# Patient Record
Sex: Male | Born: 2000 | Race: Black or African American | Hispanic: No | Marital: Single | Smoking: Current every day smoker
Health system: Southern US, Community
[De-identification: ages and names within clinical notes are randomized; demographics above are authoritative.]

## PROBLEM LIST (undated history)

## (undated) DIAGNOSIS — K429 Umbilical hernia without obstruction or gangrene: Secondary | ICD-10-CM

## (undated) HISTORY — PX: UMBILICAL HERNIA REPAIR: SHX196

---
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
## 2019-10-28 ENCOUNTER — Emergency Department (HOSPITAL_COMMUNITY)
Admission: EM | Admit: 2019-10-28 | Discharge: 2019-10-28 | Disposition: A | Discharge status: Home / Self Care | Payer: Medicaid Other | Attending: Emergency Medicine | Admitting: Emergency Medicine

## 2019-10-28 ENCOUNTER — Encounter (HOSPITAL_COMMUNITY): Payer: Self-pay | Admitting: Emergency Medicine

## 2019-10-28 DIAGNOSIS — W260XXA Contact with knife, initial encounter: Secondary | ICD-10-CM | POA: Diagnosis not present

## 2019-10-28 DIAGNOSIS — Y929 Unspecified place or not applicable: Secondary | ICD-10-CM | POA: Insufficient documentation

## 2019-10-28 DIAGNOSIS — Y999 Unspecified external cause status: Secondary | ICD-10-CM | POA: Diagnosis not present

## 2019-10-28 DIAGNOSIS — Y939 Activity, unspecified: Secondary | ICD-10-CM | POA: Insufficient documentation

## 2019-10-28 DIAGNOSIS — S61012A Laceration without foreign body of left thumb without damage to nail, initial encounter: Secondary | ICD-10-CM | POA: Diagnosis not present

## 2019-10-28 DIAGNOSIS — Z23 Encounter for immunization: Secondary | ICD-10-CM | POA: Diagnosis not present

## 2019-10-28 MED ORDER — LIDOCAINE HCL (PF) 1 % IJ SOLN
5.0000 mL | Freq: Once | INTRAMUSCULAR | Status: AC
  Administered 2019-10-28: 5 mL
  Filled 2019-10-28: qty 5

## 2019-10-28 MED ORDER — TETANUS-DIPHTH-ACELL PERTUSSIS 5-2.5-18.5 LF-MCG/0.5 IM SUSP
0.5000 mL | Freq: Once | INTRAMUSCULAR | Status: AC
  Administered 2019-10-28: 0.5 mL via INTRAMUSCULAR
  Filled 2019-10-28: qty 0.5

## 2019-10-28 NOTE — ED Triage Notes (Signed)
Pt reports he accidentally cut L hand near thumb with box cutter earlier today.

## 2019-10-28 NOTE — ED Provider Notes (Signed)
[facility] EMERGENCY DEPARTMENT Provider Note   CSN: [id number] Arrival date & time: 10/28/19  2041     History Chief Complaint  Patient presents with  . Laceration    Roger Valentine is a 19 y.o. male.  HPI 19 y.o. male with complaints/comments per nursing/medical assistant note, with all such history reviewed for accuracy and confirmed by myself.  The patient's relevant past medical, surgical and social history was reviewed in Epic.  HPI Comments: Roger Valentine is a 19 y.o. male with no significant medical history who presents to the Emergency Department complaining of a laceration that occurred to his left hand about 8 hours ago. Pt was cutting boxes with a box cutter and slipped resulting in a 3 cm laceration to the proximal dorsal aspect of the left thumb. No active bleeding. He notes mild pain overlying the site. He cleaned the wound with peroxide PTA and was urged by a family member to come to the ED for sutures. He is unsure of the timing of his last tetanus vaccination.  He denies any CP, SOB, numbness, tingling, weakness.     Past Medical History:  Diagnosis Date  . Umbilical hernia     There are no problems to display for this patient.   Past Surgical History:  Procedure Laterality Date  . UMBILICAL HERNIA REPAIR         No family history on file.  Social History   Tobacco Use  . Smoking status: Never Smoker  . Smokeless tobacco: Never Used  Substance Use Topics  . Alcohol use: No  . Drug use: Not on file    Home Medications Prior to Admission medications   Medication Sig Start Date End Date Taking? Authorizing Provider  amoxicillin (AMOXIL) 500 MG capsule Take 1 capsule (500 mg total) by mouth 3 (three) times daily. 06/27/18   Fayrene Helper, PA-C  benzonatate (TESSALON) 100 MG capsule Take 1 capsule (100 mg total) by mouth every 8 (eight) hours. 06/27/18   Fayrene Helper, PA-C  ibuprofen (ADVIL,MOTRIN) 100 MG/5ML suspension Take  30 mLs (600 mg total) by mouth every 6 (six) hours as needed for pain or fever. 05/06/13   Marcellina Millin, MD  loratadine (CLARITIN) 10 MG tablet Take 10 mg by mouth daily.    [provider]    Allergies    Patient has no known allergies.  Review of Systems   Review of Systems  Musculoskeletal: Positive for myalgias.  Skin: Positive for wound. Negative for color change.  Neurological: Negative for weakness and numbness.    Physical Exam Updated Vital Signs BP 128/69 (BP Location: Right Arm)   Pulse 78   Temp 98.2 F (36.8 C) (Oral)   Resp 16   SpO2 97%   Physical Exam Vitals and nursing note reviewed.  Constitutional:      General: He is not in acute distress.    Appearance: Normal appearance. He is normal weight. He is not ill-appearing, toxic-appearing or diaphoretic.  HENT:     Head: Normocephalic and atraumatic.     Nose: Nose normal.  Eyes:     Extraocular Movements: Extraocular movements intact.  Cardiovascular:     Rate and Rhythm: Normal rate and regular rhythm.     Pulses: Normal pulses.     Heart sounds: Normal heart sounds. No murmur. No friction rub. No gallop.   Pulmonary:     Effort: Pulmonary effort is normal. No respiratory distress.     Breath sounds:  Normal breath sounds. No stridor. No wheezing, rhonchi or rales.  Abdominal:     General: Abdomen is flat.  Musculoskeletal:     Cervical back: Normal range of motion.  Skin:    General: Skin is warm and dry.     Capillary Refill: Capillary refill takes less than 2 seconds.     Findings: Wound present.     Comments: 3 cm well approximated laceration noted to the dorsal aspect of the left thumb. No active bleeding. Mild TTP overlying the site. FROM of the fingers of the left hand. Good cap refill. Distal sensation intact. Grip strength 5/5.   Neurological:     General: No focal deficit present.     Mental Status: He is alert.     Sensory: Sensation is intact.  Psychiatric:        Mood and  Affect: Mood normal.        Behavior: Behavior normal.     ED Results / Procedures / Treatments   Labs (all labs ordered are listed, but only abnormal results are displayed) Labs Reviewed - No data to display  EKG None  Radiology No results found.  Procedures .Marland KitchenLaceration Repair  Date/Time: 10/28/2019 11:06 PM Performed by: Placido Sou, PA-C Authorized by: Placido Sou, PA-C   Consent:    Consent obtained:  Verbal   Consent given by:  Patient   Risks discussed:  Infection, pain and retained foreign body   Alternatives discussed:  No treatment Anesthesia (see MAR for exact dosages):    Anesthesia method:  Local infiltration   Local anesthetic:  Lidocaine 1% w/o epi Laceration details:    Location:  Finger   Finger location:  L thumb   Length (cm):  3 Repair type:    Repair type:  Simple Pre-procedure details:    Preparation:  Patient was prepped and draped in usual sterile fashion Exploration:    Hemostasis achieved with:  Direct pressure   Wound exploration: wound explored through full range of motion     Contaminated: no   Treatment:    Area cleansed with:  Betadine   Amount of cleaning:  Standard   Irrigation solution:  Sterile water   Visualized foreign bodies/material removed: no   Skin repair:    Repair method:  Sutures   Suture size:  5-0   Suture material:  Prolene   Suture technique:  Simple interrupted   Number of sutures:  5 Approximation:    Approximation:  Close Post-procedure details:    Dressing:  Antibiotic ointment and adhesive bandage   Patient tolerance of procedure:  Tolerated well, no immediate complications   (including critical care time)  Medications Ordered in ED Medications  lidocaine (PF) (XYLOCAINE) 1 % injection 5 mL (has no administration in time range)  Tdap (BOOSTRIX) injection 0.5 mL (has no administration in time range)   ED Course  I have reviewed the triage vital signs and the nursing notes.  Pertinent  labs & imaging results that were available during my care of the patient were reviewed by me and considered in my medical decision making (see chart for details).    MDM Rules/Calculators/A&P                      Pt is a 19 year old male that presents with a 3 cm laceration to the left proximal dorsal thumb. Wound well approximated and closed with 5 simple interrupted 5-0 prolene sutures. He tolerated the procedure well. Tetanus  updated in the ED. He works in a [employer] and discussed properly cleaning the wound daily, applying antibiotic ointment to the site and keeping it bandaged and covered while at work. He understands to return to the ED or visit his PCP in 7-10 days for suture removal.  Discussed next steps with the patient and their questions were answered. They verbalized understanding. They were amicable at the time of discharge. VSS.  Patient discharged to home/self care.  Condition at discharge: Stable  Note: Portions of this report may have been transcribed using voice recognition software. Every effort was made to ensure accuracy; however, inadvertent computerized transcription errors may be present.    Final Clinical Impression(s) / ED Diagnoses Final diagnoses:  Laceration of left thumb without foreign body without damage to nail, initial encounter    Rx / DC Orders ED Discharge Orders    None       Rayna Sexton, PA-C 10/28/19 2317    Malvin Johns, MD 10/28/19 2350

## 2019-10-28 NOTE — Discharge Instructions (Signed)
Please return to the emergency department or visit your PCP in 7-10 days for removal of your sutures. Please properly clean the wound daily and apply antibiotic ointment to the site. Return to the emergency department with any new or worsening symptoms including but not limited to fevers, chills, worsening pain, or discharge from the wound.

## 2019-11-12 ENCOUNTER — Other Ambulatory Visit: Payer: Self-pay

## 2019-11-12 ENCOUNTER — Ambulatory Visit (HOSPITAL_COMMUNITY)
Admission: EM | Admit: 2019-11-12 | Discharge: 2019-11-12 | Disposition: A | Discharge status: Home / Self Care | Payer: Medicaid Other

## 2019-11-12 DIAGNOSIS — Z4802 Encounter for removal of sutures: Secondary | ICD-10-CM

## 2019-11-12 NOTE — ED Triage Notes (Signed)
Patient here today for suture removal x 5 at left thumb. Suture place on 10/28/2019. Wound clean and dry. instruction review with patient to keep area clean and dry. Follow up if necessary.

## 2021-12-28 ENCOUNTER — Ambulatory Visit
Admission: RE | Admit: 2021-12-28 | Discharge: 2021-12-28 | Disposition: A | Discharge status: Home / Self Care | Payer: Medicaid Other | Source: Ambulatory Visit | Attending: Family Medicine | Admitting: Family Medicine

## 2021-12-28 ENCOUNTER — Other Ambulatory Visit: Payer: Self-pay

## 2021-12-28 VITALS — BP 131/74 | HR 85 | Temp 98.2°F | Resp 18

## 2021-12-28 DIAGNOSIS — Z113 Encounter for screening for infections with a predominantly sexual mode of transmission: Secondary | ICD-10-CM | POA: Insufficient documentation

## 2021-12-28 NOTE — ED Triage Notes (Signed)
Pt here for STD screening; pt denies sx  

## 2021-12-28 NOTE — ED Provider Notes (Signed)
[facility]    CSN: [id number] Arrival date & time: 12/28/21  2778      History   Chief Complaint Chief Complaint  Patient presents with   Exposure to STD    HPI Roger Valentine is a 21 y.o. male.    Exposure to STD  Here for STD screening.  He does not have any penile discharge or itching or dysuria.  No fever or abdominal pain  Past Medical History:  Diagnosis Date   Umbilical hernia     There are no problems to display for this patient.   Past Surgical History:  Procedure Laterality Date   UMBILICAL HERNIA REPAIR         Home Medications    Prior to Admission medications   Medication Sig Start Date End Date Taking? Authorizing Provider  benzonatate (TESSALON) 100 MG capsule Take 1 capsule (100 mg total) by mouth every 8 (eight) hours. Patient not taking: Reported on 10/28/2019 06/27/18   Fayrene Helper, PA-C  ibuprofen (ADVIL,MOTRIN) 100 MG/5ML suspension Take 30 mLs (600 mg total) by mouth every 6 (six) hours as needed for pain or fever. Patient not taking: Reported on 10/28/2019 05/06/13   Marcellina Millin, MD    Family History History reviewed. No pertinent family history.  Social History Social History   Tobacco Use   Smoking status: Never   Smokeless tobacco: Never  Substance Use Topics   Alcohol use: No     Allergies   Patient has no known allergies.   Review of Systems Review of Systems   Physical Exam Triage Vital Signs ED Triage Vitals  Enc Vitals Group     BP 12/28/21 1045 131/74     Pulse Rate 12/28/21 1045 85     Resp 12/28/21 1045 18     Temp 12/28/21 1045 98.2 F (36.8 C)     Temp Source 12/28/21 1045 Oral     SpO2 12/28/21 1045 98 %     Weight --      Height --      Head Circumference --      Peak Flow --      Pain Score 12/28/21 1046 0     Pain Loc --      Pain Edu? --      Excl. in GC? --    No data found.  Updated Vital Signs BP 131/74 (BP Location: Left Arm)   Pulse 85   Temp 98.2 F (36.8 C)  (Oral)   Resp 18   SpO2 98%   Visual Acuity Right Eye Distance:   Left Eye Distance:   Bilateral Distance:    Right Eye Near:   Left Eye Near:    Bilateral Near:     Physical Exam Vitals reviewed.  Constitutional:      General: He is not in acute distress.    Appearance: He is not ill-appearing, toxic-appearing or diaphoretic.  Cardiovascular:     Rate and Rhythm: Normal rate and regular rhythm.  Skin:    Coloration: Skin is not jaundiced or pale.  Neurological:     Mental Status: He is alert and oriented to person, place, and time.  Psychiatric:        Behavior: Behavior normal.     UC Treatments / Results  Labs (all labs ordered are listed, but only abnormal results are displayed) Labs Reviewed  CYTOLOGY, (ORAL, ANAL, URETHRAL) ANCILLARY ONLY    EKG   Radiology No results found.  Procedures Procedures (  including critical care time)  Medications Ordered in UC Medications - No data to display  Initial Impression / Assessment and Plan / UC Course  I have reviewed the triage vital signs and the nursing notes.  Pertinent labs & imaging results that were available during my care of the patient were reviewed by me and considered in my medical decision making (see chart for details).     Self swab is done, and he will notified of any positives and be treated per protocol.  He declines any blood test today Final Clinical Impressions(s) / UC Diagnoses   Final diagnoses:  None   Discharge Instructions   None    ED Prescriptions   None    PDMP not reviewed this encounter.   Zenia Resides, MD 12/28/21 1106

## 2021-12-28 NOTE — Discharge Instructions (Addendum)
Staff will notify you if anything is positive on the swab 

## 2021-12-31 LAB — CYTOLOGY, (ORAL, ANAL, URETHRAL) ANCILLARY ONLY
Chlamydia: NEGATIVE
Comment: NEGATIVE
Comment: NEGATIVE
Comment: NORMAL
Neisseria Gonorrhea: NEGATIVE
Trichomonas: POSITIVE — AB

## 2022-01-01 ENCOUNTER — Telehealth (HOSPITAL_COMMUNITY): Payer: Self-pay | Admitting: Emergency Medicine

## 2022-01-01 MED ORDER — METRONIDAZOLE 500 MG PO TABS: 2000.0000 mg | ORAL_TABLET | Freq: Once | ORAL | 0 refills | Status: AC

## 2022-02-16 ENCOUNTER — Ambulatory Visit
Admission: EM | Admit: 2022-02-16 | Discharge: 2022-02-16 | Disposition: A | Discharge status: Home / Self Care | Payer: Medicaid Other | Attending: Physician Assistant | Admitting: Physician Assistant

## 2022-02-16 DIAGNOSIS — Z113 Encounter for screening for infections with a predominantly sexual mode of transmission: Secondary | ICD-10-CM

## 2022-02-16 DIAGNOSIS — N4889 Other specified disorders of penis: Secondary | ICD-10-CM

## 2022-02-16 NOTE — Discharge Instructions (Signed)
Monitor your MyChart for these results.  We will contact you if anything is positive and we need to arrange treatment.  Do not have sex until you receive the results.  You should use a condom for sexual encounter.  If you develop any recurrent irritation or additional symptoms including discharge, burning when you pee, fever, abdominal pain, nausea, vomiting you should be seen immediately.

## 2022-02-16 NOTE — ED Triage Notes (Signed)
Pt c/o penile itching onset ~ 3-4 days ago. Requesting sti screening. Denies known exposure.

## 2022-02-16 NOTE — ED Provider Notes (Signed)
[facility]    CSN: [id number] Arrival date & time: 02/16/22  5621      History   Chief Complaint Chief Complaint  Patient presents with   sti screening    HPI Roger Valentine is a 21 y.o. male.   Patient presents today requesting STI testing.  He denies any current symptoms including penile irritation, pelvic pain, abdominal pain, fever, nausea, vomiting, penile discharge, dysuria.  He does have a history of trichomonas in May 2023 and reports completing treatment with resolution of symptoms.  He is sexually active with male partners but does not consistently use condoms.  Denies recent antibiotic use.  He was experiencing some balanitis which he describes as irritation of the glans penis.  This has since resolved and he is not experiencing any symptoms.  He declined HIV and syphilis testing.  Reports that balanitis symptoms have resolved and he does not need any medication for this and just requires STI testing.    Past Medical History:  Diagnosis Date   Umbilical hernia     There are no problems to display for this patient.   Past Surgical History:  Procedure Laterality Date   UMBILICAL HERNIA REPAIR         Home Medications    Prior to Admission medications   Not on File    Family History History reviewed. No pertinent family history.  Social History Social History   Tobacco Use   Smoking status: Never   Smokeless tobacco: Never  Substance Use Topics   Alcohol use: No     Allergies   Patient has no known allergies.   Review of Systems Review of Systems  Constitutional:  Negative for activity change, appetite change, fatigue and fever.  Gastrointestinal:  Negative for abdominal pain, diarrhea, nausea and vomiting.  Genitourinary:  Negative for dysuria, frequency, genital sores, penile discharge, penile pain, penile swelling, testicular pain and urgency.     Physical Exam Triage Vital Signs ED Triage Vitals [02/16/22 1017]   Enc Vitals Group     BP 120/60     Pulse Rate 88     Resp 18     Temp 98 F (36.7 C)     Temp Source Oral     SpO2 98 %     Weight      Height      Head Circumference      Peak Flow      Pain Score 0     Pain Loc      Pain Edu?      Excl. in GC?    No data found.  Updated Vital Signs BP 120/60 (BP Location: Left Arm)   Pulse 88   Temp 98 F (36.7 C) (Oral)   Resp 18   SpO2 98%   Visual Acuity Right Eye Distance:   Left Eye Distance:   Bilateral Distance:    Right Eye Near:   Left Eye Near:    Bilateral Near:     Physical Exam Vitals reviewed.  Constitutional:      General: He is awake.     Appearance: Normal appearance. He is well-developed. He is not ill-appearing.     Comments: Very pleasant male appears stated age in no acute distress sitting comfortably in exam room  HENT:     Head: Normocephalic and atraumatic.     Mouth/Throat:     Pharynx: No oropharyngeal exudate, posterior oropharyngeal erythema or uvula swelling.  Cardiovascular:  Rate and Rhythm: Normal rate and regular rhythm.     Heart sounds: Normal heart sounds, S1 normal and S2 normal. No murmur heard. Pulmonary:     Effort: Pulmonary effort is normal.     Breath sounds: Normal breath sounds. No stridor. No wheezing, rhonchi or rales.     Comments: Clear to auscultation bilaterally Abdominal:     General: Bowel sounds are normal.     Palpations: Abdomen is soft.     Tenderness: There is no abdominal tenderness. There is no right CVA tenderness, left CVA tenderness, guarding or rebound.  Genitourinary:    Comments: Patient declined exam Neurological:     Mental Status: He is alert.  Psychiatric:        Behavior: Behavior is cooperative.      UC Treatments / Results  Labs (all labs ordered are listed, but only abnormal results are displayed) Labs Reviewed - No data to display  EKG   Radiology No results found.  Procedures Procedures (including critical care  time)  Medications Ordered in UC Medications - No data to display  Initial Impression / Assessment and Plan / UC Course  I have reviewed the triage vital signs and the nursing notes.  Pertinent labs & imaging results that were available during my care of the patient were reviewed by me and considered in my medical decision making (see chart for details).     Offered evaluation of penis to ensure that he does not have current balanitis that requires treatment.  Patient reports that his symptoms have resolved and he declined evaluation.  Offered empiric treatment with hydrocortisone which he also declined.  STI swab collected today-results pending.  We will contact him if we need to arrange any treatment.  He is to monitor his MyChart for results.  Offered HIV and syphilis testing which he declined.  Offered STI information handout which he declined.  Discussed that if he develops any symptoms he needs to return for reevaluation.  Final Clinical Impressions(s) / UC Diagnoses   Final diagnoses:  Routine screening for STI (sexually transmitted infection)  Penile irritation     Discharge Instructions      Monitor your MyChart for these results.  We will contact you if anything is positive and we need to arrange treatment.  Do not have sex until you receive the results.  You should use a condom for sexual encounter.  If you develop any recurrent irritation or additional symptoms including discharge, burning when you pee, fever, abdominal pain, nausea, vomiting you should be seen immediately.    ED Prescriptions   None    PDMP not reviewed this encounter.   Jeani Hawking, PA-C 02/16/22 1239

## 2022-02-19 LAB — CYTOLOGY, (ORAL, ANAL, URETHRAL) ANCILLARY ONLY
Chlamydia: NEGATIVE
Comment: NEGATIVE
Comment: NEGATIVE
Comment: NORMAL
Neisseria Gonorrhea: NEGATIVE
Trichomonas: POSITIVE — AB

## 2022-02-20 ENCOUNTER — Telehealth (HOSPITAL_COMMUNITY): Payer: Self-pay | Admitting: Emergency Medicine

## 2022-02-20 MED ORDER — METRONIDAZOLE 500 MG PO TABS: 2000.0000 mg | ORAL_TABLET | Freq: Once | ORAL | 0 refills | Status: AC

## 2022-03-07 ENCOUNTER — Ambulatory Visit (HOSPITAL_COMMUNITY): Payer: Self-pay

## 2022-06-23 ENCOUNTER — Ambulatory Visit
Admission: RE | Admit: 2022-06-23 | Discharge: 2022-06-23 | Disposition: A | Discharge status: Home / Self Care | Payer: Medicaid Other | Source: Ambulatory Visit | Attending: Urgent Care | Admitting: Urgent Care

## 2022-06-23 VITALS — BP 116/72 | HR 74 | Temp 98.1°F | Resp 16

## 2022-06-23 DIAGNOSIS — Z202 Contact with and (suspected) exposure to infections with a predominantly sexual mode of transmission: Secondary | ICD-10-CM

## 2022-06-23 DIAGNOSIS — Z113 Encounter for screening for infections with a predominantly sexual mode of transmission: Secondary | ICD-10-CM | POA: Insufficient documentation

## 2022-06-23 NOTE — ED Provider Notes (Signed)
  [facility]  Note:  This document was prepared using Conservation officer, historic buildings and may include unintentional dictation errors.  MRN: [id number] DOB: 08/14/2000  Subjective:   Roger Valentine is a 21 y.o. male presenting for STI testing using the cytology swab. Denies dysuria, hematuria, urinary frequency, penile discharge, penile swelling, testicular pain, testicular swelling, anal pain, groin pain.  Has a history of trichomoniasis, last positive test was July of this past year.  He is simply try to make sure that he does not have any kind of infection.  No known exposures.  No current facility-administered medications for this encounter. No current outpatient medications on file.   No Known Allergies  Past Medical History:  Diagnosis Date   Umbilical hernia      Past Surgical History:  Procedure Laterality Date   UMBILICAL HERNIA REPAIR      History reviewed. No pertinent family history.  Social History   Tobacco Use   Smoking status: Every Day    Types: Cigars   Smokeless tobacco: Never  Vaping Use   Vaping Use: Never used  Substance Use Topics   Alcohol use: Yes    Comment: occ   Drug use: Not Currently    ROS   Objective:   Vitals: BP 116/72 (BP Location: Right Arm)   Pulse 74   Temp 98.1 F (36.7 C) (Oral)   Resp 16   SpO2 98%   Physical Exam Constitutional:      General: He is not in acute distress.    Appearance: Normal appearance. He is well-developed and normal weight. He is not ill-appearing, toxic-appearing or diaphoretic.  HENT:     Head: Normocephalic and atraumatic.     Right Ear: External ear normal.     Left Ear: External ear normal.     Nose: Nose normal.     Mouth/Throat:     Pharynx: Oropharynx is clear.  Eyes:     General: No scleral icterus.       Right eye: No discharge.        Left eye: No discharge.     Extraocular Movements: Extraocular movements intact.  Cardiovascular:     Rate and  Rhythm: Normal rate.  Pulmonary:     Effort: Pulmonary effort is normal.  Musculoskeletal:     Cervical back: Normal range of motion.  Neurological:     Mental Status: He is alert and oriented to person, place, and time.  Psychiatric:        Mood and Affect: Mood normal.        Behavior: Behavior normal.        Thought Content: Thought content normal.        Judgment: Judgment normal.     Assessment and Plan :   PDMP not reviewed this encounter.  1. Screen for STD (sexually transmitted disease)     Cytology pending we will treat as appropriate based off of results.   Wallis Bamberg, [place] 06/23/22 [phone]

## 2022-06-23 NOTE — Discharge Instructions (Addendum)
We will let you know through MyChart if your results are negative.  If your results are positive for the penis swab then our nurse that follows up with results we will give you a call.

## 2022-06-23 NOTE — ED Triage Notes (Signed)
Pt requesting STD screening-denies c/o-denies known exposure-NAD-steady gait

## 2022-06-24 LAB — CYTOLOGY, (ORAL, ANAL, URETHRAL) ANCILLARY ONLY
Chlamydia: NEGATIVE
Comment: NEGATIVE
Comment: NEGATIVE
Comment: NORMAL
Neisseria Gonorrhea: NEGATIVE
Trichomonas: POSITIVE — AB

## 2022-06-25 ENCOUNTER — Telehealth (HOSPITAL_COMMUNITY): Payer: Self-pay | Admitting: Emergency Medicine

## 2022-06-25 MED ORDER — METRONIDAZOLE 500 MG PO TABS: 2000.0000 mg | ORAL_TABLET | Freq: Once | ORAL | 0 refills | Status: AC

## 2022-12-07 ENCOUNTER — Ambulatory Visit
Admission: EM | Admit: 2022-12-07 | Discharge: 2022-12-07 | Disposition: A | Discharge status: Home / Self Care | Payer: Medicaid Other | Attending: Family Medicine | Admitting: Family Medicine

## 2022-12-07 DIAGNOSIS — Z202 Contact with and (suspected) exposure to infections with a predominantly sexual mode of transmission: Secondary | ICD-10-CM | POA: Insufficient documentation

## 2022-12-07 NOTE — ED Provider Notes (Signed)
[name]    CSN: [id number] Arrival date & time: 12/07/22  1242      History   Chief Complaint Chief Complaint  Patient presents with   sti check     HPI Roger Valentine is a 22 y.o. male.   HPI 22 year old male presents with possible STD exposure.  Request STD check.  Patient denies current symptoms of STD.  PMH significant for umbilical hernia   Past Medical History:  Diagnosis Date   Umbilical hernia     There are no problems to display for this patient.   Past Surgical History:  Procedure Laterality Date   UMBILICAL HERNIA REPAIR         Home Medications    Prior to Admission medications   Not on File    Family History No family history on file.  Social History Social History   Tobacco Use   Smoking status: Every Day    Types: Cigars   Smokeless tobacco: Never  Vaping Use   Vaping Use: Never used  Substance Use Topics   Alcohol use: Yes    Comment: occ   Drug use: Not Currently     Allergies   Patient has no known allergies.   Review of Systems Review of Systems  All other systems reviewed and are negative.    Physical Exam Triage Vital Signs ED Triage Vitals  Enc Vitals Group     BP 12/07/22 1256 106/72     Pulse Rate 12/07/22 1256 87     Resp 12/07/22 1256 20     Temp 12/07/22 1256 98 F (36.7 C)     Temp Source 12/07/22 1256 Oral     SpO2 12/07/22 1256 97 %     Weight --      Height --      Head Circumference --      Peak Flow --      Pain Score 12/07/22 1255 0     Pain Loc --      Pain Edu? --      Excl. in GC? --    No data found.  Updated Vital Signs BP 106/72   Pulse 87   Temp 98 F (36.7 C) (Oral)   Resp 20   SpO2 97%   Physical Exam Vitals and nursing note reviewed.  Constitutional:      Appearance: Normal appearance. He is normal weight.  HENT:     Head: Normocephalic and atraumatic.     Mouth/Throat:     Mouth: Mucous membranes are moist.     Pharynx: Oropharynx is clear.   Eyes:     Extraocular Movements: Extraocular movements intact.     Conjunctiva/sclera: Conjunctivae normal.     Pupils: Pupils are equal, round, and reactive to light.  Cardiovascular:     Rate and Rhythm: Normal rate and regular rhythm.     Pulses: Normal pulses.     Heart sounds: Normal heart sounds.  Pulmonary:     Effort: Pulmonary effort is normal.     Breath sounds: Normal breath sounds. No wheezing, rhonchi or rales.  Musculoskeletal:        General: Normal range of motion.     Cervical back: Normal range of motion and neck supple.  Skin:    General: Skin is warm and dry.  Neurological:     General: No focal deficit present.     Mental Status: He is alert and oriented to person, place, and time. Mental  status is at baseline.      UC Treatments / Results  Labs (all labs ordered are listed, but only abnormal results are displayed) Labs Reviewed  CYTOLOGY, (ORAL, ANAL, URETHRAL) ANCILLARY ONLY    EKG   Radiology No results found.  Procedures Procedures (including critical care time)  Medications Ordered in UC Medications - No data to display  Initial Impression / Assessment and Plan / UC Course  I have reviewed the triage vital signs and the nursing notes.  Pertinent labs & imaging results that were available during my care of the patient were reviewed by me and considered in my medical decision making (see chart for details).     MDM: 1.  Potential exposure to STD-Aptima swab ordered. Advised patient we will follow-up with Aptima swab results once received.  Patient discharged home, hemodynamically stable. Final Clinical Impressions(s) / UC Diagnoses   Final diagnoses:  Potential exposure to STD     Discharge Instructions      Advised patient we will follow-up with Aptima swab results once received.     ED Prescriptions   None    PDMP not reviewed this encounter.   Trevor Iha, FNP 12/07/22 1348

## 2022-12-07 NOTE — Discharge Instructions (Addendum)
Advised patient we will follow-up with Aptima swab results once received. 

## 2022-12-07 NOTE — ED Triage Notes (Signed)
Pt presents to uc with need for sti check denies any recent exposure or symptoms.

## 2022-12-08 LAB — CYTOLOGY, (ORAL, ANAL, URETHRAL) ANCILLARY ONLY
Chlamydia: NEGATIVE
Comment: NEGATIVE
Comment: NEGATIVE
Comment: NORMAL
Neisseria Gonorrhea: NEGATIVE
Trichomonas: NEGATIVE

## 2022-12-25 ENCOUNTER — Ambulatory Visit
Admission: EM | Admit: 2022-12-25 | Discharge: 2022-12-25 | Disposition: A | Discharge status: Home / Self Care | Payer: Medicaid Other | Attending: Internal Medicine | Admitting: Internal Medicine

## 2022-12-25 DIAGNOSIS — Z113 Encounter for screening for infections with a predominantly sexual mode of transmission: Secondary | ICD-10-CM | POA: Insufficient documentation

## 2022-12-25 NOTE — Discharge Instructions (Signed)
STD testing is pending.  Will call if it is positive and treat if necessary.  Please follow-up with any further concerns.

## 2022-12-25 NOTE — ED Provider Notes (Signed)
[facility]    CSN: [id number] Arrival date & time: 12/25/22  0841      History   Chief Complaint No chief complaint on file.   HPI HUSSAIN NATALE is a 22 y.o. male.   Patient presents today for routine STD testing.  He denies any exposure or any associated symptoms.  Patient does not want testing for HIV or syphilis.     Past Medical History:  Diagnosis Date   Umbilical hernia     There are no problems to display for this patient.   Past Surgical History:  Procedure Laterality Date   UMBILICAL HERNIA REPAIR         Home Medications    Prior to Admission medications   Not on File    Family History History reviewed. No pertinent family history.  Social History Social History   Tobacco Use   Smoking status: Every Day    Types: Cigars   Smokeless tobacco: Never  Vaping Use   Vaping Use: Never used  Substance Use Topics   Alcohol use: Yes    Comment: occ   Drug use: Not Currently     Allergies   Patient has no known allergies.   Review of Systems Review of Systems Per HPI  Physical Exam Triage Vital Signs ED Triage Vitals  Enc Vitals Group     BP 12/25/22 0951 109/72     Pulse Rate 12/25/22 0951 80     Resp 12/25/22 0951 15     Temp 12/25/22 0951 97.9 F (36.6 C)     Temp Source 12/25/22 0951 Oral     SpO2 12/25/22 0951 97 %     Weight --      Height --      Head Circumference --      Peak Flow --      Pain Score 12/25/22 0954 0     Pain Loc --      Pain Edu? --      Excl. in GC? --    No data found.  Updated Vital Signs BP 109/72 (BP Location: Left Arm)   Pulse 80   Temp 97.9 F (36.6 C) (Oral)   Resp 15   SpO2 97%   Visual Acuity Right Eye Distance:   Left Eye Distance:   Bilateral Distance:    Right Eye Near:   Left Eye Near:    Bilateral Near:     Physical Exam Constitutional:      General: He is not in acute distress.    Appearance: Normal appearance. He is not toxic-appearing or  diaphoretic.  HENT:     Head: Normocephalic and atraumatic.  Eyes:     Extraocular Movements: Extraocular movements intact.     Conjunctiva/sclera: Conjunctivae normal.  Pulmonary:     Effort: Pulmonary effort is normal.  Genitourinary:    Comments: Deferred with shared decision making. Self swab performed.  Neurological:     General: No focal deficit present.     Mental Status: He is alert and oriented to person, place, and time. Mental status is at baseline.  Psychiatric:        Mood and Affect: Mood normal.        Behavior: Behavior normal.        Thought Content: Thought content normal.        Judgment: Judgment normal.      UC Treatments / Results  Labs (all labs ordered are listed, but only abnormal results  are displayed) Labs Reviewed  CYTOLOGY, (ORAL, ANAL, URETHRAL) ANCILLARY ONLY    EKG   Radiology No results found.  Procedures Procedures (including critical care time)  Medications Ordered in UC Medications - No data to display  Initial Impression / Assessment and Plan / UC Course  I have reviewed the triage vital signs and the nursing notes.  Pertinent labs & imaging results that were available during my care of the patient were reviewed by me and considered in my medical decision making (see chart for details).     Patient here for routine STD testing with no exposure.  Cytology swab pending.  Awaiting results.  Advised patient to follow-up with any further concerns.  Patient verbalized understanding and was agreeable with plan. Final Clinical Impressions(s) / UC Diagnoses   Final diagnoses:  Screening examination for venereal disease     Discharge Instructions      STD testing is pending.  Will call if it is positive and treat if necessary.  Please follow-up with any further concerns.    ED Prescriptions   None    PDMP not reviewed this encounter.   Gustavus Bryant, [place] 12/25/22 1039

## 2022-12-25 NOTE — ED Triage Notes (Signed)
Pt c/o needing routine STD testing  swab only

## 2022-12-26 LAB — CYTOLOGY, (ORAL, ANAL, URETHRAL) ANCILLARY ONLY
Chlamydia: NEGATIVE
Comment: NEGATIVE
Comment: NEGATIVE
Comment: NORMAL
Neisseria Gonorrhea: NEGATIVE
Trichomonas: POSITIVE — AB

## 2022-12-30 ENCOUNTER — Telehealth (HOSPITAL_COMMUNITY): Payer: Self-pay | Admitting: Emergency Medicine

## 2022-12-30 MED ORDER — METRONIDAZOLE 500 MG PO TABS: 2000.0000 mg | ORAL_TABLET | Freq: Once | ORAL | 0 refills | Status: AC

## 2023-09-01 ENCOUNTER — Ambulatory Visit
Admission: EM | Admit: 2023-09-01 | Discharge: 2023-09-01 | Disposition: A | Discharge status: Home / Self Care | Payer: Self-pay | Attending: Physician Assistant | Admitting: Physician Assistant

## 2023-09-01 ENCOUNTER — Encounter: Payer: Self-pay | Admitting: Emergency Medicine

## 2023-09-01 ENCOUNTER — Other Ambulatory Visit: Payer: Self-pay

## 2023-09-01 DIAGNOSIS — J111 Influenza due to unidentified influenza virus with other respiratory manifestations: Secondary | ICD-10-CM

## 2023-09-01 LAB — POCT INFLUENZA A/B
Influenza A, POC: POSITIVE — AB
Influenza B, POC: NEGATIVE

## 2023-09-01 MED ORDER — OSELTAMIVIR PHOSPHATE 75 MG PO CAPS: 75.0000 mg | ORAL_CAPSULE | Freq: Two times a day (BID) | ORAL | 0 refills | Status: AC

## 2023-09-01 NOTE — ED Provider Notes (Signed)
[facility]    CSN: [id number] Arrival date & time: 09/01/23  1539      History   Chief Complaint Chief Complaint  Patient presents with   Cough   Fever    HPI Roger Valentine is a 23 y.o. male.   Patient here today for evaluation of fever, cough, generalized weakness with diarrhea that started a few days ago.  He has not had any vomiting.  He has taken DayQuil with mild relief.  The history is provided by the patient.  Cough Associated symptoms: chills, fever and sore throat   Associated symptoms: no ear pain, no eye discharge and no shortness of breath   Fever Associated symptoms: chills, congestion, cough, diarrhea and sore throat   Associated symptoms: no ear pain, no nausea and no vomiting     Past Medical History:  Diagnosis Date   Umbilical hernia     There are no active problems to display for this patient.   Past Surgical History:  Procedure Laterality Date   UMBILICAL HERNIA REPAIR         Home Medications    Prior to Admission medications   Medication Sig Start Date End Date Taking? Authorizing Provider  oseltamivir (TAMIFLU) 75 MG capsule Take 1 capsule (75 mg total) by mouth every 12 (twelve) hours. 09/01/23  Yes Tomi Bamberger, PA-C    Family History History reviewed. No pertinent family history.  Social History Social History   Tobacco Use   Smoking status: Every Day    Types: Cigars   Smokeless tobacco: Never  Vaping Use   Vaping status: Never Used  Substance Use Topics   Alcohol use: Yes    Comment: occ   Drug use: Not Currently     Allergies   Patient has no known allergies.   Review of Systems Review of Systems  Constitutional:  Positive for chills and fever.  HENT:  Positive for congestion and sore throat. Negative for ear pain.   Eyes:  Negative for discharge and redness.  Respiratory:  Positive for cough. Negative for shortness of breath.   Gastrointestinal:  Positive for diarrhea. Negative for  abdominal pain, nausea and vomiting.     Physical Exam Triage Vital Signs ED Triage Vitals  Encounter Vitals Group     BP 09/01/23 1710 (!) 88/50     Systolic BP Percentile --      Diastolic BP Percentile --      Pulse Rate 09/01/23 1710 98     Resp 09/01/23 1710 18     Temp 09/01/23 1710 99.9 F (37.7 C)     Temp Source 09/01/23 1710 Oral     SpO2 09/01/23 1710 96 %     Weight --      Height --      Head Circumference --      Peak Flow --      Pain Score 09/01/23 1711 5     Pain Loc --      Pain Education --      Exclude from Growth Chart --    No data found.  Updated Vital Signs BP (!) 88/50 (BP Location: Left Arm)   Pulse 98   Temp 99.9 F (37.7 C) (Oral)   Resp 18   SpO2 96%   Visual Acuity Right Eye Distance:   Left Eye Distance:   Bilateral Distance:    Right Eye Near:   Left Eye Near:    Bilateral Near:  Physical Exam Vitals and nursing note reviewed.  Constitutional:      General: He is not in acute distress.    Appearance: Normal appearance. He is not ill-appearing.  HENT:     Head: Normocephalic and atraumatic.     Right Ear: Tympanic membrane normal.     Left Ear: Tympanic membrane normal.     Nose: Congestion present.     Mouth/Throat:     Mouth: Mucous membranes are moist.     Pharynx: Oropharynx is clear. Posterior oropharyngeal erythema present. No oropharyngeal exudate.  Eyes:     Conjunctiva/sclera: Conjunctivae normal.  Cardiovascular:     Rate and Rhythm: Normal rate and regular rhythm.     Heart sounds: Normal heart sounds. No murmur heard. Pulmonary:     Effort: Pulmonary effort is normal. No respiratory distress.     Breath sounds: Normal breath sounds. No wheezing, rhonchi or rales.  Skin:    General: Skin is warm and dry.  Neurological:     Mental Status: He is alert.  Psychiatric:        Mood and Affect: Mood normal.        Thought Content: Thought content normal.      UC Treatments / Results  Labs (all labs  ordered are listed, but only abnormal results are displayed) Labs Reviewed  POCT INFLUENZA A/B - Abnormal; Notable for the following components:      Result Value   Influenza A, POC Positive (*)    All other components within normal limits    EKG   Radiology No results found.  Procedures Procedures (including critical care time)  Medications Ordered in UC Medications - No data to display  Initial Impression / Assessment and Plan / UC Course  I have reviewed the triage vital signs and the nursing notes.  Pertinent labs & imaging results that were available during my care of the patient were reviewed by me and considered in my medical decision making (see chart for details).   Flu test positive in office.  Will treat with Tamiflu.  Advised symptomatic treatment, increase rest and fluids.  Advised follow-up if no gradual improvement or with any worsening symptoms.  Blood pressure is noted to be lower in office today however with patient's illness and prior lower blood pressures no concern for need for possible emergent evaluation.  Final Clinical Impressions(s) / UC Diagnoses   Final diagnoses:  Influenza   Discharge Instructions   None    ED Prescriptions     Medication Sig Dispense Auth. Provider   oseltamivir (TAMIFLU) 75 MG capsule Take 1 capsule (75 mg total) by mouth every 12 (twelve) hours. 10 capsule Tomi Bamberger, PA-C      PDMP not reviewed this encounter.   Tomi Bamberger, PA-C 09/01/23 1750

## 2023-09-01 NOTE — ED Triage Notes (Signed)
Pt here for fever and cough with weakness; pt sts some diarrhea; pt noted hypotension; RM aware
